# Patient Record
Sex: Female | Born: 1970
Health system: Southern US, Community
[De-identification: ages and names within clinical notes are randomized; demographics above are authoritative.]

## PROBLEM LIST (undated history)

## (undated) DIAGNOSIS — F419 Anxiety disorder, unspecified: Secondary | ICD-10-CM

## (undated) HISTORY — DX: Anxiety disorder, unspecified: F41.9

## (undated) HISTORY — PX: FOOT SURGERY: SHX648

---

## 2011-06-20 ENCOUNTER — Other Ambulatory Visit: Payer: Self-pay | Admitting: Obstetrics and Gynecology

## 2011-06-20 DIAGNOSIS — N63 Unspecified lump in unspecified breast: Secondary | ICD-10-CM

## 2011-07-01 ENCOUNTER — Ambulatory Visit
Admission: RE | Admit: 2011-07-01 | Discharge: 2011-07-01 | Disposition: A | Discharge status: Home / Self Care | Payer: BC Managed Care – PPO | Source: Ambulatory Visit | Attending: Obstetrics and Gynecology | Admitting: Obstetrics and Gynecology

## 2011-07-01 DIAGNOSIS — N63 Unspecified lump in unspecified breast: Secondary | ICD-10-CM

## 2012-07-23 ENCOUNTER — Other Ambulatory Visit: Payer: Self-pay | Admitting: Obstetrics and Gynecology

## 2012-07-23 DIAGNOSIS — Z1231 Encounter for screening mammogram for malignant neoplasm of breast: Secondary | ICD-10-CM

## 2012-09-03 ENCOUNTER — Ambulatory Visit: Payer: BC Managed Care – PPO

## 2012-10-01 ENCOUNTER — Ambulatory Visit
Admission: RE | Admit: 2012-10-01 | Discharge: 2012-10-01 | Disposition: A | Discharge status: Home / Self Care | Payer: BC Managed Care – PPO | Source: Ambulatory Visit | Attending: Obstetrics and Gynecology | Admitting: Obstetrics and Gynecology

## 2012-10-01 DIAGNOSIS — Z1231 Encounter for screening mammogram for malignant neoplasm of breast: Secondary | ICD-10-CM

## 2013-11-07 ENCOUNTER — Other Ambulatory Visit: Payer: Self-pay

## 2013-11-07 DIAGNOSIS — Z1231 Encounter for screening mammogram for malignant neoplasm of breast: Secondary | ICD-10-CM

## 2013-11-18 ENCOUNTER — Ambulatory Visit: Payer: BC Managed Care – PPO

## 2013-11-25 ENCOUNTER — Other Ambulatory Visit: Payer: Self-pay | Admitting: Obstetrics and Gynecology

## 2013-11-25 ENCOUNTER — Other Ambulatory Visit: Payer: Self-pay

## 2013-11-25 DIAGNOSIS — N644 Mastodynia: Secondary | ICD-10-CM

## 2013-12-02 ENCOUNTER — Ambulatory Visit
Admission: RE | Admit: 2013-12-02 | Discharge: 2013-12-02 | Disposition: A | Discharge status: Home / Self Care | Payer: BC Managed Care – PPO | Source: Ambulatory Visit | Attending: Obstetrics and Gynecology | Admitting: Obstetrics and Gynecology

## 2013-12-02 DIAGNOSIS — N644 Mastodynia: Secondary | ICD-10-CM

## 2014-09-08 ENCOUNTER — Ambulatory Visit (INDEPENDENT_AMBULATORY_CARE_PROVIDER_SITE_OTHER): Payer: BLUE CROSS/BLUE SHIELD | Admitting: Sports Medicine

## 2014-09-08 ENCOUNTER — Ambulatory Visit (INDEPENDENT_AMBULATORY_CARE_PROVIDER_SITE_OTHER): Payer: BLUE CROSS/BLUE SHIELD

## 2014-09-08 ENCOUNTER — Encounter: Payer: Self-pay | Admitting: Sports Medicine

## 2014-09-08 VITALS — BP 113/69 | HR 69 | Ht 66.0 in | Wt 122.0 lb

## 2014-09-08 DIAGNOSIS — M545 Low back pain, unspecified: Secondary | ICD-10-CM

## 2014-09-08 MED ORDER — DIAZEPAM 5 MG PO TABS: 5.0000 mg | ORAL_TABLET | Freq: Every evening | ORAL | Status: DC | PRN

## 2014-09-08 MED ORDER — MELOXICAM 15 MG PO TABS: ORAL_TABLET | ORAL | Status: DC

## 2014-09-08 MED ORDER — PREDNISONE 50 MG PO TABS: ORAL_TABLET | ORAL | Status: DC

## 2014-09-08 NOTE — Assessment & Plan Note (Signed)
Symptoms do sound predominantly discogenic. X-rays, prednisone, meloxicam, Valium at bedtime. Home rehabilitation exercises.  Return in one month, MRI of no better.

## 2014-09-08 NOTE — Progress Notes (Signed)
  Subjective:    CC: Low back pain.   HPI:  This is a pleasant 44 year old female, for the past week she's had increasing pain in the left side of her mid paralumbar spine, no radiation, the radicular pain, no constitutional symptoms. Pain is moderate, persistent. It is worse with flexion suggesting a discogenic component.  Past medical history, Surgical history, Family history not pertinant except as noted below, Social history, Allergies, and medications have been entered into the medical record, reviewed, and no changes needed.   Review of Systems: No headache, visual changes, nausea, vomiting, diarrhea, constipation, dizziness, abdominal pain, skin rash, fevers, chills, night sweats, swollen lymph nodes, weight loss, chest pain, body aches, joint swelling, muscle aches, shortness of breath, mood changes, visual or auditory hallucinations.  Objective:    General: Well Developed, well nourished, and in no acute distress.  Neuro: Alert and oriented x3, extra-ocular muscles intact, sensation grossly intact.  HEENT: Normocephalic, atraumatic, pupils equal round reactive to light, neck supple, no masses, no lymphadenopathy, thyroid nonpalpable.  Skin: Warm and dry, no rashes noted.  Cardiac: Regular rate and rhythm, no murmurs rubs or gallops.  Respiratory: Clear to auscultation bilaterally. Not using accessory muscles, speaking in full sentences.  Abdominal: Soft, nontender, nondistended, positive bowel sounds, no masses, no organomegaly.  Back Exam:  Inspection: Unremarkable  Motion: Flexion 45 deg, Extension 45 deg, Side Bending to 45 deg bilaterally,  Rotation to 45 deg bilaterally  SLR laying: Negative  XSLR laying: Negative  Palpable tenderness: Left paralumbar, and left sacroiliac joint. FABER: negative. Sensory change: Gross sensation intact to all lumbar and sacral dermatomes.  Reflexes: 2+ at both patellar tendons, 2+ at achilles tendons, Babinski's downgoing.  Strength at foot    Plantar-flexion: 5/5 Dorsi-flexion: 5/5 Eversion: 5/5 Inversion: 5/5  Leg strength  Quad: 5/5 Hamstring: 5/5 Hip flexor: 5/5 Hip abductors: 5/5  Gait unremarkable.  X-rays showed age-appropriate endplate changes.  Impression and Recommendations:    The patient was counselled, risk factors were discussed, anticipatory guidance given.

## 2014-10-06 ENCOUNTER — Ambulatory Visit: Payer: BLUE CROSS/BLUE SHIELD | Admitting: Sports Medicine

## 2016-01-25 DIAGNOSIS — Z681 Body mass index (BMI) 19 or less, adult: Secondary | ICD-10-CM | POA: Diagnosis not present

## 2016-01-25 DIAGNOSIS — Z01419 Encounter for gynecological examination (general) (routine) without abnormal findings: Secondary | ICD-10-CM | POA: Diagnosis not present

## 2016-01-25 DIAGNOSIS — Z1151 Encounter for screening for human papillomavirus (HPV): Secondary | ICD-10-CM | POA: Diagnosis not present

## 2016-01-25 DIAGNOSIS — Z1231 Encounter for screening mammogram for malignant neoplasm of breast: Secondary | ICD-10-CM | POA: Diagnosis not present

## 2016-03-29 DIAGNOSIS — M25531 Pain in right wrist: Secondary | ICD-10-CM | POA: Diagnosis not present

## 2016-03-29 DIAGNOSIS — H6993 Unspecified Eustachian tube disorder, bilateral: Secondary | ICD-10-CM | POA: Diagnosis not present

## 2016-05-20 ENCOUNTER — Encounter: Payer: Self-pay | Admitting: Sports Medicine

## 2016-05-20 ENCOUNTER — Ambulatory Visit (INDEPENDENT_AMBULATORY_CARE_PROVIDER_SITE_OTHER): Payer: BLUE CROSS/BLUE SHIELD | Admitting: Sports Medicine

## 2016-05-20 DIAGNOSIS — M25531 Pain in right wrist: Secondary | ICD-10-CM

## 2016-05-20 MED ORDER — MELOXICAM 15 MG PO TABS: ORAL_TABLET | ORAL | 3 refills | Status: DC

## 2016-05-20 NOTE — Assessment & Plan Note (Signed)
Multifactorial including thumb basal joint arthritis, likely some carpal tunnel syndrome, and de Quervain's tenosynovitis. Next line she does have a thumb spica brace that she will wear daily and night for a couple of weeks. Adding meloxicam, her symptoms are significant better after a prednisone taper at urgent care. Rehabilitation exercises given. Return to see me as needed in 2-4 weeks.

## 2016-05-20 NOTE — Progress Notes (Signed)
  Subjective:    CC: Right wrist pain  HPI: For the past 2 months this pleasant 45 year old female has had pain that she localizes on the volar aspect of her right wrist with occasional shooting pains into the fingers, also with some discomfort over the dorsal lateral distal radius. No trauma. She has increased her workout regimen, and has been moving a lot of heavy items at work. Pain is mild, persistent. She was seen in urgent care where x-rays were negative and she was given prednisone and overall feels much better right now.  Past medical history:  Negative.  See flowsheet/record as well for more information.  Surgical history: Negative.  See flowsheet/record as well for more information.  Family history: Negative.  See flowsheet/record as well for more information.  Social history: Negative.  See flowsheet/record as well for more information.  Allergies, and medications have been entered into the medical record, reviewed, and no changes needed.   Review of Systems: No fevers, chills, night sweats, weight loss, chest pain, or shortness of breath.   Objective:    General: Well Developed, well nourished, and in no acute distress.  Neuro: Alert and oriented x3, extra-ocular muscles intact, sensation grossly intact.  HEENT: Normocephalic, atraumatic, pupils equal round reactive to light, neck supple, no masses, no lymphadenopathy, thyroid nonpalpable.  Skin: Warm and dry, no rashes. Cardiac: Regular rate and rhythm, no murmurs rubs or gallops, no lower extremity edema.  Respiratory: Clear to auscultation bilaterally. Not using accessory muscles, speaking in full sentences. Right Wrist: Maybe slight fullness over the proximal first extensor Department of the dorsum of the wrist ROM smooth and normal with good flexion and extension and ulnar/radial deviation that is symmetrical with opposite wrist. Palpation is normal over metacarpals, navicular, lunate, and TFCC; tendons without tenderness/  swelling No snuffbox tenderness. No tenderness over Canal of [place]. Strength 5/5 in all directions without pain. Negative Finkelstein, tinel's and phalens. Negative Watson's test. There is mild tenderness to palpation at the thumb basal joint and the volar radiocarpal joint.  Impression and Recommendations:    Right wrist pain Multifactorial including thumb basal joint arthritis, likely some carpal tunnel syndrome, and de Quervain's tenosynovitis. Next line she does have a thumb spica brace that she will wear daily and night for a couple of weeks. Adding meloxicam, her symptoms are significant better after a prednisone taper at urgent care. Rehabilitation exercises given. Return to see me as needed in 2-4 weeks.

## 2016-05-30 DIAGNOSIS — J329 Chronic sinusitis, unspecified: Secondary | ICD-10-CM | POA: Diagnosis not present

## 2016-09-21 DIAGNOSIS — H9202 Otalgia, left ear: Secondary | ICD-10-CM | POA: Diagnosis not present

## 2016-09-21 DIAGNOSIS — H6982 Other specified disorders of Eustachian tube, left ear: Secondary | ICD-10-CM | POA: Diagnosis not present

## 2016-10-19 DIAGNOSIS — H6982 Other specified disorders of Eustachian tube, left ear: Secondary | ICD-10-CM | POA: Diagnosis not present

## 2017-02-16 DIAGNOSIS — Z01419 Encounter for gynecological examination (general) (routine) without abnormal findings: Secondary | ICD-10-CM | POA: Diagnosis not present

## 2017-02-16 DIAGNOSIS — Z1231 Encounter for screening mammogram for malignant neoplasm of breast: Secondary | ICD-10-CM | POA: Diagnosis not present

## 2017-02-16 DIAGNOSIS — Z6820 Body mass index (BMI) 20.0-20.9, adult: Secondary | ICD-10-CM | POA: Diagnosis not present

## 2017-07-27 DIAGNOSIS — H9202 Otalgia, left ear: Secondary | ICD-10-CM | POA: Diagnosis not present

## 2017-07-27 DIAGNOSIS — H6982 Other specified disorders of Eustachian tube, left ear: Secondary | ICD-10-CM | POA: Diagnosis not present

## 2017-10-02 DIAGNOSIS — H04123 Dry eye syndrome of bilateral lacrimal glands: Secondary | ICD-10-CM | POA: Diagnosis not present

## 2017-10-25 DIAGNOSIS — H5213 Myopia, bilateral: Secondary | ICD-10-CM | POA: Diagnosis not present

## 2018-03-13 DIAGNOSIS — Z6820 Body mass index (BMI) 20.0-20.9, adult: Secondary | ICD-10-CM | POA: Diagnosis not present

## 2018-03-13 DIAGNOSIS — Z01419 Encounter for gynecological examination (general) (routine) without abnormal findings: Secondary | ICD-10-CM | POA: Diagnosis not present

## 2018-03-15 ENCOUNTER — Ambulatory Visit
Admission: RE | Admit: 2018-03-15 | Discharge: 2018-03-15 | Disposition: A | Discharge status: Home / Self Care | Payer: BLUE CROSS/BLUE SHIELD | Source: Ambulatory Visit | Attending: Obstetrics and Gynecology | Admitting: Obstetrics and Gynecology

## 2018-03-15 ENCOUNTER — Other Ambulatory Visit: Payer: Self-pay | Admitting: Obstetrics and Gynecology

## 2018-03-15 ENCOUNTER — Ambulatory Visit: Payer: Self-pay

## 2018-03-15 DIAGNOSIS — N644 Mastodynia: Secondary | ICD-10-CM

## 2018-03-15 DIAGNOSIS — R922 Inconclusive mammogram: Secondary | ICD-10-CM | POA: Diagnosis not present

## 2018-09-06 DIAGNOSIS — H10021 Other mucopurulent conjunctivitis, right eye: Secondary | ICD-10-CM | POA: Diagnosis not present

## 2019-02-11 DIAGNOSIS — M25551 Pain in right hip: Secondary | ICD-10-CM | POA: Diagnosis not present

## 2019-02-11 DIAGNOSIS — M9905 Segmental and somatic dysfunction of pelvic region: Secondary | ICD-10-CM | POA: Diagnosis not present

## 2019-02-11 DIAGNOSIS — M461 Sacroiliitis, not elsewhere classified: Secondary | ICD-10-CM | POA: Diagnosis not present

## 2019-02-11 DIAGNOSIS — M9904 Segmental and somatic dysfunction of sacral region: Secondary | ICD-10-CM | POA: Diagnosis not present

## 2019-02-13 DIAGNOSIS — M25551 Pain in right hip: Secondary | ICD-10-CM | POA: Diagnosis not present

## 2019-02-13 DIAGNOSIS — M9904 Segmental and somatic dysfunction of sacral region: Secondary | ICD-10-CM | POA: Diagnosis not present

## 2019-02-13 DIAGNOSIS — M9905 Segmental and somatic dysfunction of pelvic region: Secondary | ICD-10-CM | POA: Diagnosis not present

## 2019-02-13 DIAGNOSIS — M461 Sacroiliitis, not elsewhere classified: Secondary | ICD-10-CM | POA: Diagnosis not present

## 2019-02-18 DIAGNOSIS — M9905 Segmental and somatic dysfunction of pelvic region: Secondary | ICD-10-CM | POA: Diagnosis not present

## 2019-02-18 DIAGNOSIS — M25551 Pain in right hip: Secondary | ICD-10-CM | POA: Diagnosis not present

## 2019-02-18 DIAGNOSIS — M9904 Segmental and somatic dysfunction of sacral region: Secondary | ICD-10-CM | POA: Diagnosis not present

## 2019-02-18 DIAGNOSIS — M461 Sacroiliitis, not elsewhere classified: Secondary | ICD-10-CM | POA: Diagnosis not present

## 2019-02-20 DIAGNOSIS — M9904 Segmental and somatic dysfunction of sacral region: Secondary | ICD-10-CM | POA: Diagnosis not present

## 2019-02-20 DIAGNOSIS — M25551 Pain in right hip: Secondary | ICD-10-CM | POA: Diagnosis not present

## 2019-02-20 DIAGNOSIS — M9905 Segmental and somatic dysfunction of pelvic region: Secondary | ICD-10-CM | POA: Diagnosis not present

## 2019-02-20 DIAGNOSIS — M461 Sacroiliitis, not elsewhere classified: Secondary | ICD-10-CM | POA: Diagnosis not present

## 2019-02-22 DIAGNOSIS — M9905 Segmental and somatic dysfunction of pelvic region: Secondary | ICD-10-CM | POA: Diagnosis not present

## 2019-02-22 DIAGNOSIS — M461 Sacroiliitis, not elsewhere classified: Secondary | ICD-10-CM | POA: Diagnosis not present

## 2019-02-22 DIAGNOSIS — M25551 Pain in right hip: Secondary | ICD-10-CM | POA: Diagnosis not present

## 2019-02-22 DIAGNOSIS — M9904 Segmental and somatic dysfunction of sacral region: Secondary | ICD-10-CM | POA: Diagnosis not present

## 2019-02-27 DIAGNOSIS — M461 Sacroiliitis, not elsewhere classified: Secondary | ICD-10-CM | POA: Diagnosis not present

## 2019-02-27 DIAGNOSIS — M9905 Segmental and somatic dysfunction of pelvic region: Secondary | ICD-10-CM | POA: Diagnosis not present

## 2019-02-27 DIAGNOSIS — M25551 Pain in right hip: Secondary | ICD-10-CM | POA: Diagnosis not present

## 2019-02-27 DIAGNOSIS — M9904 Segmental and somatic dysfunction of sacral region: Secondary | ICD-10-CM | POA: Diagnosis not present

## 2019-03-01 DIAGNOSIS — M461 Sacroiliitis, not elsewhere classified: Secondary | ICD-10-CM | POA: Diagnosis not present

## 2019-03-01 DIAGNOSIS — M9904 Segmental and somatic dysfunction of sacral region: Secondary | ICD-10-CM | POA: Diagnosis not present

## 2019-03-01 DIAGNOSIS — M9902 Segmental and somatic dysfunction of thoracic region: Secondary | ICD-10-CM | POA: Diagnosis not present

## 2019-03-01 DIAGNOSIS — M9905 Segmental and somatic dysfunction of pelvic region: Secondary | ICD-10-CM | POA: Diagnosis not present

## 2019-03-01 DIAGNOSIS — M9903 Segmental and somatic dysfunction of lumbar region: Secondary | ICD-10-CM | POA: Diagnosis not present

## 2019-03-01 DIAGNOSIS — M25551 Pain in right hip: Secondary | ICD-10-CM | POA: Diagnosis not present

## 2019-03-01 DIAGNOSIS — M546 Pain in thoracic spine: Secondary | ICD-10-CM | POA: Diagnosis not present

## 2019-03-01 DIAGNOSIS — M545 Low back pain: Secondary | ICD-10-CM | POA: Diagnosis not present

## 2019-03-27 DIAGNOSIS — Z1151 Encounter for screening for human papillomavirus (HPV): Secondary | ICD-10-CM | POA: Diagnosis not present

## 2019-03-27 DIAGNOSIS — Z01419 Encounter for gynecological examination (general) (routine) without abnormal findings: Secondary | ICD-10-CM | POA: Diagnosis not present

## 2019-03-27 DIAGNOSIS — Z1231 Encounter for screening mammogram for malignant neoplasm of breast: Secondary | ICD-10-CM | POA: Diagnosis not present

## 2019-03-27 DIAGNOSIS — Z6820 Body mass index (BMI) 20.0-20.9, adult: Secondary | ICD-10-CM | POA: Diagnosis not present

## 2019-05-01 ENCOUNTER — Other Ambulatory Visit: Payer: Self-pay

## 2019-05-01 ENCOUNTER — Ambulatory Visit (INDEPENDENT_AMBULATORY_CARE_PROVIDER_SITE_OTHER): Payer: BC Managed Care – PPO | Admitting: Sports Medicine

## 2019-05-01 ENCOUNTER — Encounter: Payer: Self-pay | Admitting: Sports Medicine

## 2019-05-01 DIAGNOSIS — R29818 Other symptoms and signs involving the nervous system: Secondary | ICD-10-CM | POA: Diagnosis not present

## 2019-05-01 DIAGNOSIS — R278 Other lack of coordination: Secondary | ICD-10-CM | POA: Diagnosis not present

## 2019-05-01 DIAGNOSIS — F39 Unspecified mood [affective] disorder: Secondary | ICD-10-CM | POA: Diagnosis not present

## 2019-05-01 MED ORDER — DIAZEPAM 5 MG PO TABS: ORAL_TABLET | ORAL | 0 refills | Status: DC

## 2019-05-01 MED ORDER — ESCITALOPRAM OXALATE 5 MG PO TABS: 5.0000 mg | ORAL_TABLET | Freq: Every day | ORAL | 3 refills | Status: DC

## 2019-05-01 NOTE — Progress Notes (Signed)
Subjective:    CC: Vibrating sensation  HPI: This is a pleasant 48 year old female, for the past several months she is noted an odd sensation of feeling shaky, usually in bed in the evenings after her busy days of ended.  She has a very busy schedule, she runs a business, she has an autistic child, trying to homeschool him, she has had anxiety and depression in the past.  She denies any suicidal or homicidal ideation.  She does have some family history of a movement disorder including Parkinson's disease, and has noted a bit of tremor in her hands.  I reviewed the past medical history, family history, social history, surgical history, and allergies today and no changes were needed.  Please see the problem list section below in epic for further details.  Past Medical History: No past medical history on file. Past Surgical History: No past surgical history on file. Social History: Social History   Socioeconomic History  . Marital status: Unknown    Spouse name: Not on file  . Number of children: Not on file  . Years of education: Not on file  . Highest education level: Not on file  Occupational History  . Not on file  Social Needs  . Financial resource strain: Not on file  . Food insecurity    Worry: Not on file    Inability: Not on file  . Transportation needs    Medical: Not on file    Non-medical: Not on file  Tobacco Use  . Smoking status: Never Smoker  . Smokeless tobacco: Never Used  Substance and Sexual Activity  . Alcohol use: Not on file  . Drug use: Not on file  . Sexual activity: Not on file  Lifestyle  . Physical activity    Days per week: Not on file    Minutes per session: Not on file  . Stress: Not on file  Relationships  . Social Musicianconnections    Talks on phone: Not on file    Gets together: Not on file    Attends religious service: Not on file    Active member of club or organization: Not on file    Attends meetings of clubs or organizations: Not on  file    Relationship status: Not on file  Other Topics Concern  . Not on file  Social History Narrative  . Not on file   Family History: Family History  Problem Relation Age of Onset  . Breast cancer Neg Hx    Allergies: No Known Allergies Medications: See med rec.  Review of Systems: No fevers, chills, night sweats, weight loss, chest pain, or shortness of breath.   Objective:    General: Well Developed, well nourished, and in no acute distress.  Neuro: Alert and oriented x3, extra-ocular muscles intact, sensation grossly intact.  Cranial nerves II through XII are intact, motor, sensory functions are intact, she did have dysdiadochokinesia of her left hand.  No finger dysmetria. HEENT: Normocephalic, atraumatic, pupils equal round reactive to light, neck supple, no masses, no lymphadenopathy, thyroid nonpalpable.  Skin: Warm and dry, no rashes. Cardiac: Regular rate and rhythm, no murmurs rubs or gallops, no lower extremity edema.  Respiratory: Clear to auscultation bilaterally. Not using accessory muscles, speaking in full sentences.  Impression and Recommendations:    Dysdiadochokinesia Left hand dysdiadochokinesia, adding a brain MRI with and without contrast.   We will need renal function first. Valium for claustrophobia.  Mood disorder (HCC) There is definitely an element of  anxiety and depression. Adding Lexapro 5, referral for behavioral therapy. Return in 6 weeks for a repeat PHQ and GAD.   ___________________________________________ Gwen Her. Dianah Field, M.D., ABFM., CAQSM. Primary Care and Sports Medicine [facility]  Adjunct Professor of [place] of [facility] of [employer]

## 2019-05-01 NOTE — Assessment & Plan Note (Signed)
Left hand dysdiadochokinesia, adding a brain MRI with and without contrast.   We will need renal function first. Valium for claustrophobia.

## 2019-05-01 NOTE — Assessment & Plan Note (Signed)
There is definitely an element of anxiety and depression. Adding Lexapro 5, referral for behavioral therapy. Return in 6 weeks for a repeat PHQ and GAD.

## 2019-05-02 LAB — COMPLETE METABOLIC PANEL WITH GFR
AG Ratio: 1.9 (calc) (ref 1.0–2.5)
ALT: 13 U/L (ref 6–29)
AST: 17 U/L (ref 10–35)
Albumin: 4.6 g/dL (ref 3.6–5.1)
Alkaline phosphatase (APISO): 35 U/L (ref 31–125)
BUN: 11 mg/dL (ref 7–25)
CO2: 28 mmol/L (ref 20–32)
Calcium: 9.4 mg/dL (ref 8.6–10.2)
Chloride: 103 mmol/L (ref 98–110)
Creat: 0.7 mg/dL (ref 0.50–1.10)
GFR, Est African American: 119 mL/min/{1.73_m2} (ref >=60)
GFR, Est Non African American: 102 mL/min/{1.73_m2} (ref >=60)
Globulin: 2.4 g/dL (calc) (ref 1.9–3.7)
Glucose, Bld: 102 mg/dL — ABNORMAL HIGH (ref 65–99)
Potassium: 4.5 mmol/L (ref 3.5–5.3)
Sodium: 140 mmol/L (ref 135–146)
Total Bilirubin: 0.6 mg/dL (ref 0.2–1.2)
Total Protein: 7 g/dL (ref 6.1–8.1)

## 2019-05-06 ENCOUNTER — Other Ambulatory Visit: Payer: Self-pay

## 2019-05-06 ENCOUNTER — Ambulatory Visit (INDEPENDENT_AMBULATORY_CARE_PROVIDER_SITE_OTHER): Payer: BC Managed Care – PPO

## 2019-05-06 DIAGNOSIS — R278 Other lack of coordination: Secondary | ICD-10-CM

## 2019-05-06 DIAGNOSIS — R29818 Other symptoms and signs involving the nervous system: Secondary | ICD-10-CM | POA: Diagnosis not present

## 2019-05-06 DIAGNOSIS — R251 Tremor, unspecified: Secondary | ICD-10-CM | POA: Diagnosis not present

## 2019-05-06 DIAGNOSIS — R51 Headache: Secondary | ICD-10-CM | POA: Diagnosis not present

## 2019-05-06 MED ORDER — GADOBUTROL 1 MMOL/ML IV SOLN
5.0000 mL | Freq: Once | INTRAVENOUS | Status: AC | PRN
  Administered 2019-05-06: 14:00:00 5 mL via INTRAVENOUS

## 2019-06-12 ENCOUNTER — Ambulatory Visit (INDEPENDENT_AMBULATORY_CARE_PROVIDER_SITE_OTHER): Payer: BC Managed Care – PPO | Admitting: Sports Medicine

## 2019-06-12 ENCOUNTER — Encounter: Payer: Self-pay | Admitting: Sports Medicine

## 2019-06-12 ENCOUNTER — Other Ambulatory Visit: Payer: Self-pay

## 2019-06-12 DIAGNOSIS — F39 Unspecified mood [affective] disorder: Secondary | ICD-10-CM | POA: Diagnosis not present

## 2019-06-12 MED ORDER — ESCITALOPRAM OXALATE 5 MG PO TABS: 5.0000 mg | ORAL_TABLET | Freq: Every day | ORAL | 1 refills | Status: DC

## 2019-06-12 NOTE — Assessment & Plan Note (Signed)
Fantastic improvement on Lexapro 5. She will establish with Dr. Emeterio Reeve.

## 2019-06-12 NOTE — Addendum Note (Signed)
Addended by: Silverio Decamp on: 06/12/2019 09:04 AM   Modules accepted: Orders

## 2019-06-12 NOTE — Progress Notes (Signed)
  Subjective:    CC: Follow-up  HPI: Anxiety and depression: Now well controlled on Lexapro 5.  I reviewed the past medical history, family history, social history, surgical history, and allergies today and no changes were needed.  Please see the problem list section below in epic for further details.  Past Medical History: No past medical history on file. Past Surgical History: No past surgical history on file. Social History: Social History   Socioeconomic History  . Marital status: Unknown    Spouse name: Not on file  . Number of children: Not on file  . Years of education: Not on file  . Highest education level: Not on file  Occupational History  . Not on file  Social Needs  . Financial resource strain: Not on file  . Food insecurity    Worry: Not on file    Inability: Not on file  . Transportation needs    Medical: Not on file    Non-medical: Not on file  Tobacco Use  . Smoking status: Never Smoker  . Smokeless tobacco: Never Used  Substance and Sexual Activity  . Alcohol use: Not on file  . Drug use: Not on file  . Sexual activity: Not on file  Lifestyle  . Physical activity    Days per week: Not on file    Minutes per session: Not on file  . Stress: Not on file  Relationships  . Social Herbalist on phone: Not on file    Gets together: Not on file    Attends religious service: Not on file    Active member of club or organization: Not on file    Attends meetings of clubs or organizations: Not on file    Relationship status: Not on file  Other Topics Concern  . Not on file  Social History Narrative  . Not on file   Family History: Family History  Problem Relation Age of Onset  . Breast cancer Neg Hx    Allergies: No Known Allergies Medications: See med rec.  Review of Systems: No fevers, chills, night sweats, weight loss, chest pain, or shortness of breath.   Objective:    General: Well Developed, well nourished, and in no acute  distress.  Neuro: Alert and oriented x3, extra-ocular muscles intact, sensation grossly intact.  HEENT: Normocephalic, atraumatic, pupils equal round reactive to light, neck supple, no masses, no lymphadenopathy, thyroid nonpalpable.  Skin: Warm and dry, no rashes. Cardiac: Regular rate and rhythm, no murmurs rubs or gallops, no lower extremity edema.  Respiratory: Clear to auscultation bilaterally. Not using accessory muscles, speaking in full sentences.  Impression and Recommendations:    Mood disorder ([place]) Fantastic improvement on Lexapro 5. She will establish with Dr. Emeterio Reeve.   ___________________________________________ Gwen Her. Dianah Field, M.D., ABFM., CAQSM. Primary Care and Sports Medicine [facility]  Adjunct Professor of [place] of [facility] of Medicine

## 2019-06-18 ENCOUNTER — Ambulatory Visit (INDEPENDENT_AMBULATORY_CARE_PROVIDER_SITE_OTHER): Payer: BC Managed Care – PPO | Admitting: Psychology

## 2019-06-18 DIAGNOSIS — F39 Unspecified mood [affective] disorder: Secondary | ICD-10-CM

## 2019-07-09 ENCOUNTER — Ambulatory Visit (INDEPENDENT_AMBULATORY_CARE_PROVIDER_SITE_OTHER): Payer: BC Managed Care – PPO | Admitting: Psychology

## 2019-07-09 DIAGNOSIS — F39 Unspecified mood [affective] disorder: Secondary | ICD-10-CM | POA: Diagnosis not present

## 2019-07-23 ENCOUNTER — Ambulatory Visit: Payer: BC Managed Care – PPO | Admitting: Psychology

## 2019-08-06 ENCOUNTER — Ambulatory Visit (INDEPENDENT_AMBULATORY_CARE_PROVIDER_SITE_OTHER): Payer: Self-pay | Admitting: Psychology

## 2019-08-06 DIAGNOSIS — F39 Unspecified mood [affective] disorder: Secondary | ICD-10-CM

## 2019-08-07 ENCOUNTER — Ambulatory Visit: Payer: BC Managed Care – PPO | Admitting: Psychology

## 2019-08-20 ENCOUNTER — Ambulatory Visit: Payer: Self-pay | Admitting: Psychology

## 2019-09-03 ENCOUNTER — Ambulatory Visit (INDEPENDENT_AMBULATORY_CARE_PROVIDER_SITE_OTHER): Payer: Self-pay | Admitting: Psychology

## 2019-09-03 DIAGNOSIS — F39 Unspecified mood [affective] disorder: Secondary | ICD-10-CM

## 2019-09-17 ENCOUNTER — Ambulatory Visit: Payer: Self-pay | Admitting: Psychology

## 2019-11-12 ENCOUNTER — Ambulatory Visit: Payer: Self-pay | Admitting: Psychology

## 2019-12-17 IMAGING — MR MR HEAD WO/W CM
13 series · 48 of 48 positions shown · IV contrast (5ML MULTIHANCE)
Comparison: None.

CLINICAL DATA: Tremors which are progressively worsening.
Occasional headache.

EXAM:
MRI HEAD WITHOUT AND WITH CONTRAST
TECHNIQUE: Multiplanar, multiecho pulse sequences of the brain and surrounding
structures were obtained without and with intravenous contrast.
CONTRAST:  5mL GADAVIST GADOBUTROL 1 MMOL/ML IV SOLN

[Series 3: DWI · axial · 3.0mm · 1.20mm/px · z∈[-59,+102]mm · 3 of 55 slices shown (1 of 2)]
[im 1/55]
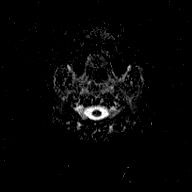
[im 28/55]
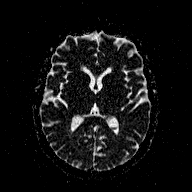
[im 55/55]
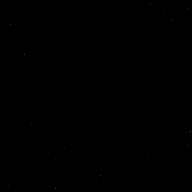

[Series 5: DWI · coronal · 3.0mm · 1.15mm/px · 2 of 45 slices shown (2 of 2)]
[im 1/45]
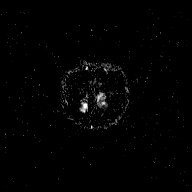
[im 45/45]
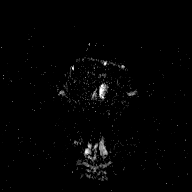

[Series 6: T1 · sagittal · 5.0mm · 0.45mm/px · 1 of 23 slices shown (1 of 2)]
[im 1/23]
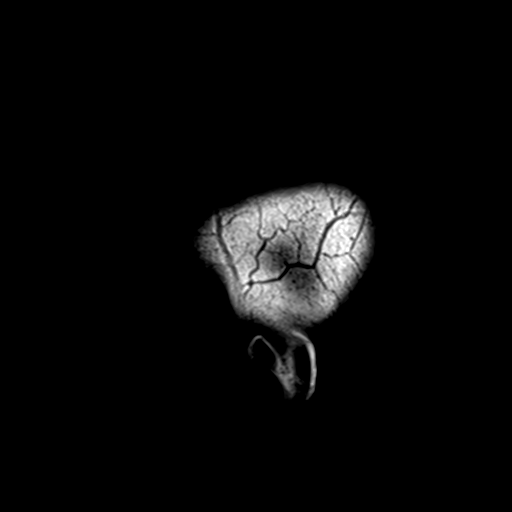

[Series 7: T2 · axial · 5.0mm · 0.72mm/px · 1 of 23 slices shown (1 of 2)]
[im 1/23]
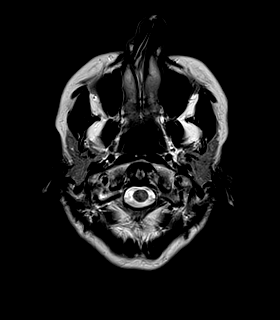

[Series 8: FLAIR · axial · 3.0mm · 0.45mm/px · z∈[-54,+106]mm · 4 of 55 slices shown (1 of 2)]
[im 1/55]
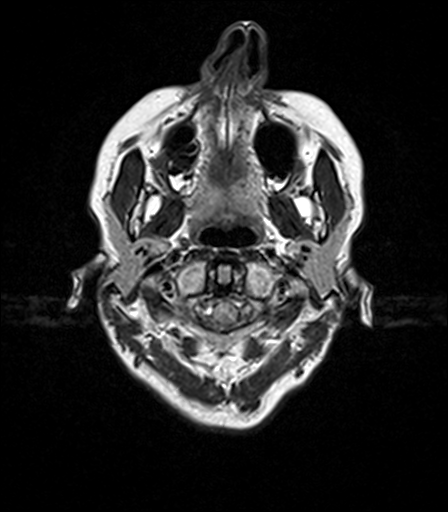
[im 19/55]
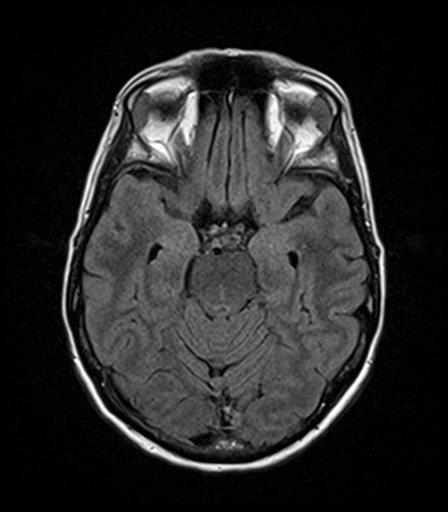
[im 37/55]
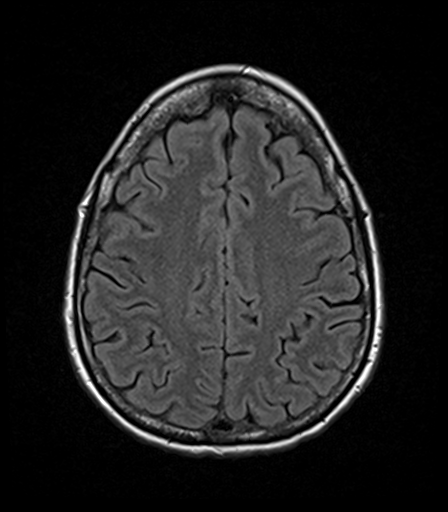
[im 55/55]
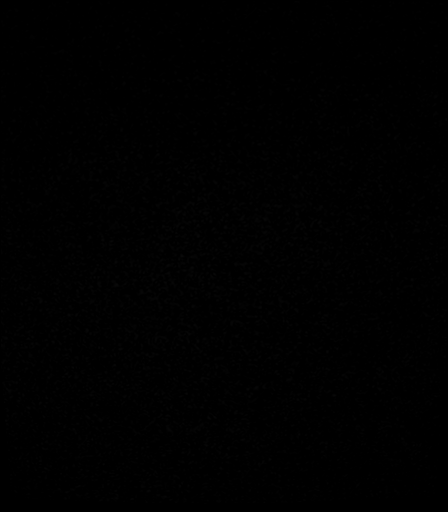

[Series 9: T2 · axial · 5.0mm · 0.72mm/px · z∈[-50,+102]mm · 2 of 23 slices shown (2 of 2)]
[im 1/23]
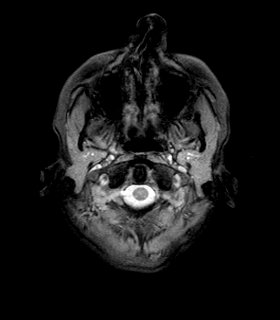
[im 23/23]
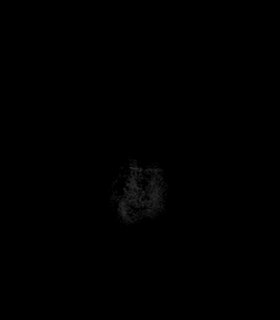

[Series 10: T1 · axial · 1.0mm · 1.00mm/px · z∈[-50,+107]mm · 11 of 160 slices shown (2 of 2)]
[im 1/160]
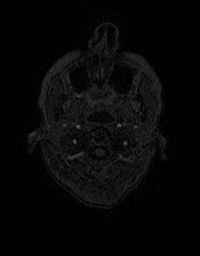
[im 16/160]
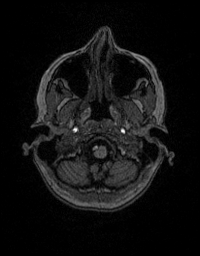
[im 32/160]
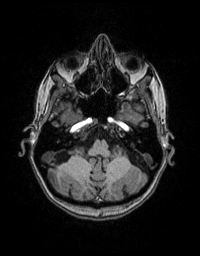
[im 48/160]
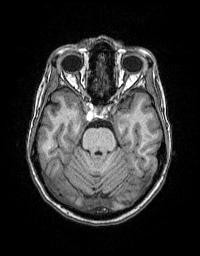
[im 64/160]
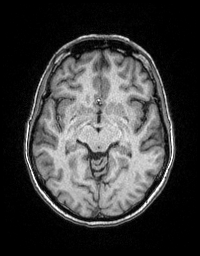
[im 80/160]
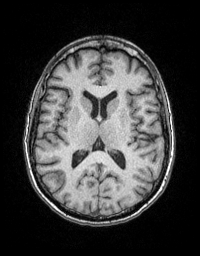
[im 96/160]
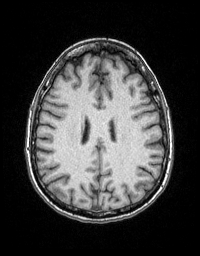
[im 112/160]
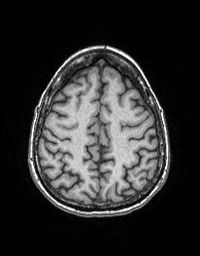
[im 128/160]
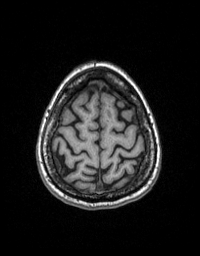
[im 144/160]
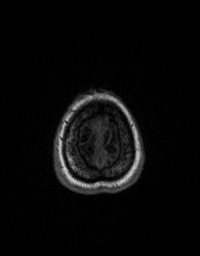
[im 160/160]
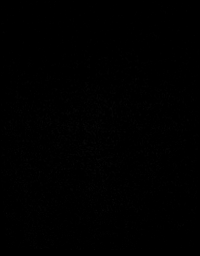

[Series 11: FLAIR · sagittal · 4.0mm · 0.45mm/px · 2 of 30 slices shown (2 of 2)]
[im 1/30]
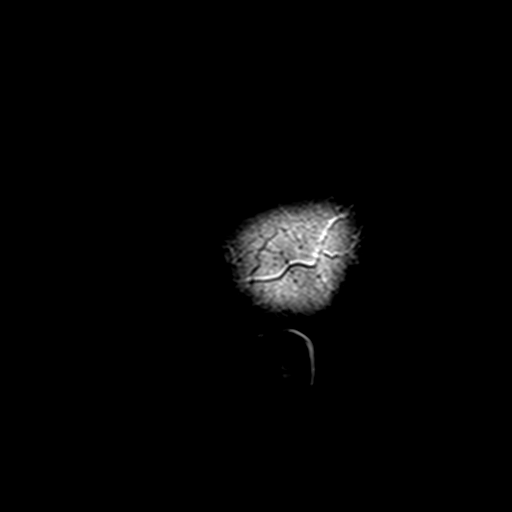
[im 30/30]
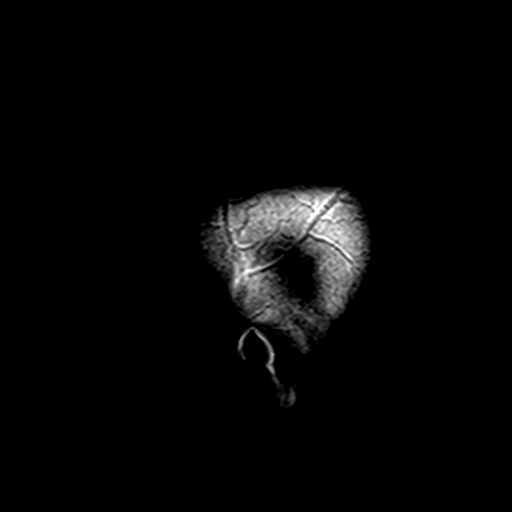

[Series 12: T2 post-contrast · coronal · 5.0mm · 0.43mm/px · 2 of 29 slices shown]
[im 1/29]
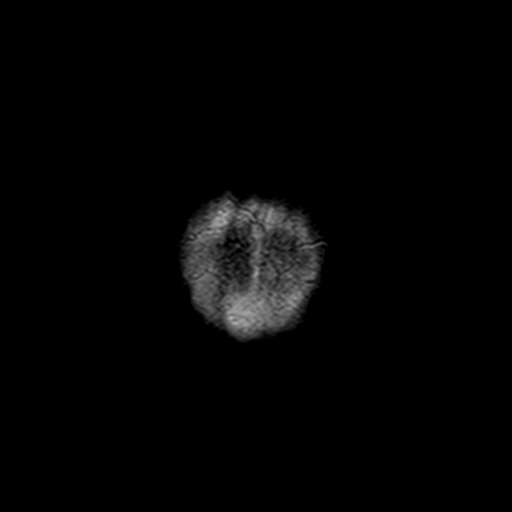
[im 29/29]
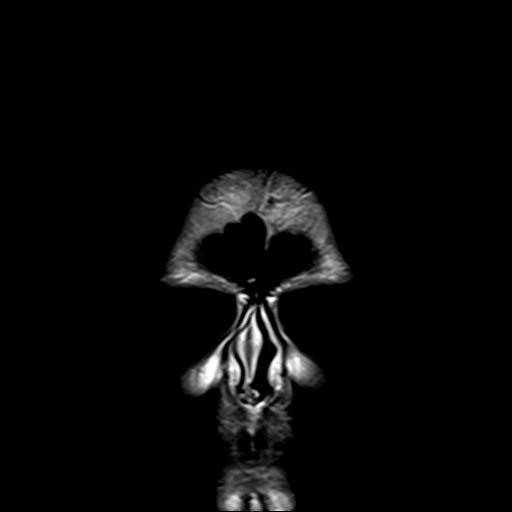

[Series 13: T1 post-contrast · axial · 1.0mm · 1.00mm/px · z∈[-50,+107]mm · 11 of 160 slices shown (1 of 2)]
[im 1/160]
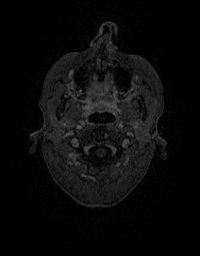
[im 16/160]
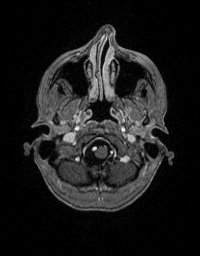
[im 32/160]
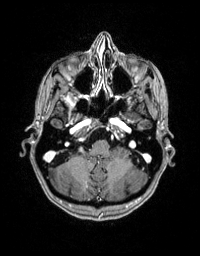
[im 48/160]
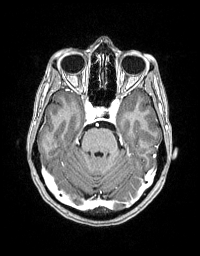
[im 64/160]
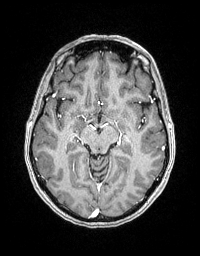
[im 80/160]
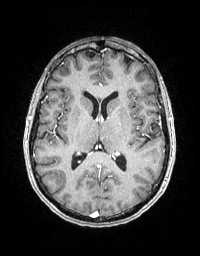
[im 96/160]
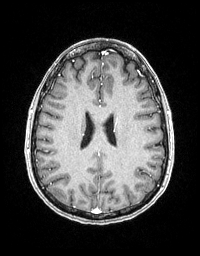
[im 112/160]
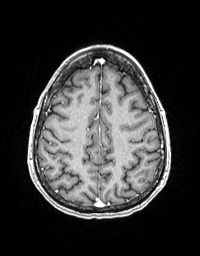
[im 128/160]
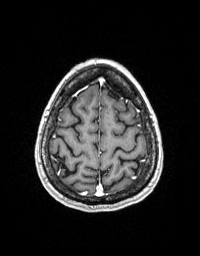
[im 144/160]
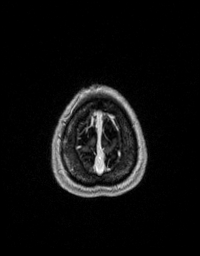
[im 160/160]
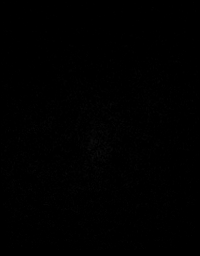

[Series 14: T1 post-contrast · coronal · 5.0mm · 0.43mm/px · 2 of 29 slices shown (2 of 2)]
[im 1/29]
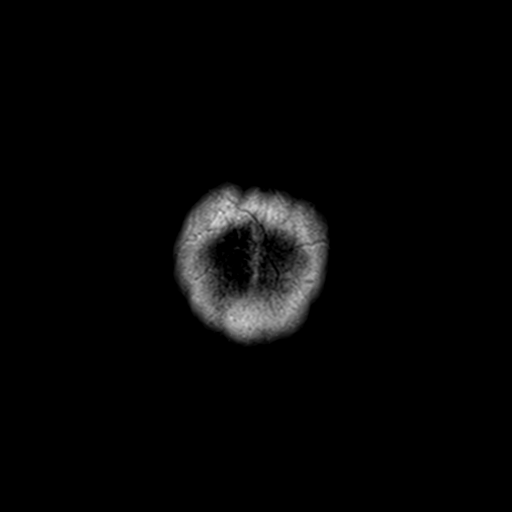
[im 29/29]
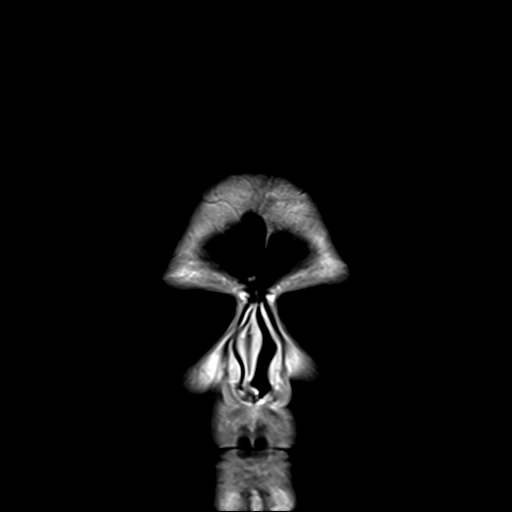

[Series 100: (id) ax · axial · 3.0mm · 1.20mm/px · z∈[-59,+102]mm · 4 of 55 slices shown]
[im 1/55]
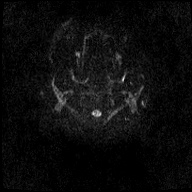
[im 19/55]
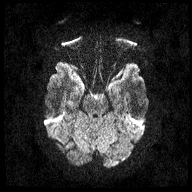
[im 37/55]
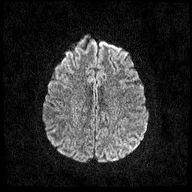
[im 55/55]
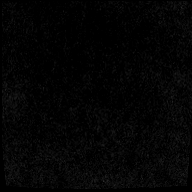

[Series 101: (id) cor · coronal · 3.0mm · 1.15mm/px · 3 of 42 slices shown]
[im 1/42]
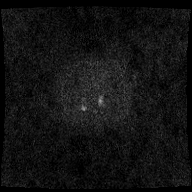
[im 21/42]
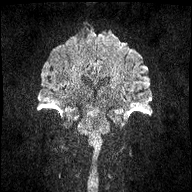
[im 42/42]
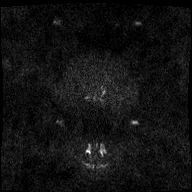

[48 of 48 positions shown; findings below may reference images not displayed]

FINDINGS: Brain: The brain has a normal appearance without evidence of
malformation, atrophy, old or acute small or large vessel
infarction, mass lesion, hemorrhage, hydrocephalus or extra-axial
collection. After contrast administration, no abnormal enhancement
occurs.

Vascular: Major vessels at the base of the brain show flow. Venous
sinuses appear patent.

Skull and upper cervical spine: Normal.

Sinuses/Orbits: Clear/normal.

Other: None significant.
IMPRESSION: Normal examination. No abnormality seen to explain the presenting
symptoms.

## 2019-12-30 ENCOUNTER — Other Ambulatory Visit: Payer: Self-pay | Admitting: Sports Medicine

## 2019-12-30 DIAGNOSIS — F39 Unspecified mood [affective] disorder: Secondary | ICD-10-CM

## 2020-05-04 DIAGNOSIS — H5213 Myopia, bilateral: Secondary | ICD-10-CM | POA: Diagnosis not present

## 2020-07-07 DIAGNOSIS — Z6820 Body mass index (BMI) 20.0-20.9, adult: Secondary | ICD-10-CM | POA: Diagnosis not present

## 2020-07-07 DIAGNOSIS — Z1231 Encounter for screening mammogram for malignant neoplasm of breast: Secondary | ICD-10-CM | POA: Diagnosis not present

## 2020-07-07 DIAGNOSIS — Z01419 Encounter for gynecological examination (general) (routine) without abnormal findings: Secondary | ICD-10-CM | POA: Diagnosis not present

## 2020-07-13 ENCOUNTER — Other Ambulatory Visit: Payer: Self-pay | Admitting: Sports Medicine

## 2020-07-13 DIAGNOSIS — F39 Unspecified mood [affective] disorder: Secondary | ICD-10-CM

## 2020-07-13 NOTE — Telephone Encounter (Signed)
Please contact patient for scheduling with Dr. Karie Schwalbe. Thanks.

## 2020-07-13 NOTE — Telephone Encounter (Signed)
LVM for patient to get this appointment scheduled. AM

## 2020-07-13 NOTE — Telephone Encounter (Signed)
30 days approved, has not been seen in over a year.  We should do at least a virtual visit.

## 2020-07-20 DIAGNOSIS — M65871 Other synovitis and tenosynovitis, right ankle and foot: Secondary | ICD-10-CM | POA: Diagnosis not present

## 2020-08-12 ENCOUNTER — Other Ambulatory Visit: Payer: Self-pay | Admitting: Sports Medicine

## 2020-08-12 DIAGNOSIS — F39 Unspecified mood [affective] disorder: Secondary | ICD-10-CM

## 2020-10-16 DIAGNOSIS — R42 Dizziness and giddiness: Secondary | ICD-10-CM | POA: Diagnosis not present

## 2020-10-16 DIAGNOSIS — S0990XA Unspecified injury of head, initial encounter: Secondary | ICD-10-CM | POA: Diagnosis not present

## 2020-10-16 DIAGNOSIS — R110 Nausea: Secondary | ICD-10-CM | POA: Diagnosis not present

## 2021-04-06 DIAGNOSIS — K5900 Constipation, unspecified: Secondary | ICD-10-CM | POA: Diagnosis not present

## 2021-04-06 DIAGNOSIS — K3189 Other diseases of stomach and duodenum: Secondary | ICD-10-CM | POA: Diagnosis not present

## 2021-04-06 DIAGNOSIS — R100 Acute abdomen: Secondary | ICD-10-CM | POA: Diagnosis not present

## 2021-04-06 DIAGNOSIS — R1031 Right lower quadrant pain: Secondary | ICD-10-CM | POA: Diagnosis not present

## 2021-04-06 DIAGNOSIS — N888 Other specified noninflammatory disorders of cervix uteri: Secondary | ICD-10-CM | POA: Diagnosis not present

## 2021-04-07 DIAGNOSIS — R100 Acute abdomen: Secondary | ICD-10-CM | POA: Diagnosis not present

## 2021-04-07 DIAGNOSIS — K3189 Other diseases of stomach and duodenum: Secondary | ICD-10-CM | POA: Diagnosis not present

## 2021-04-07 DIAGNOSIS — N888 Other specified noninflammatory disorders of cervix uteri: Secondary | ICD-10-CM | POA: Diagnosis not present

## 2021-04-08 ENCOUNTER — Telehealth: Payer: Self-pay

## 2021-04-08 NOTE — Telephone Encounter (Addendum)
Transition Care Management Follow-up Telephone Call Date of discharge and from where: 04/07/2021 from [facility]  How have you been since you were released from the hospital? Pt states that she is feeling slightly better. Pt did not have any questions or concerns at this time.  Any questions or concerns? No  Items Reviewed: Did the pt receive and understand the discharge instructions provided? Yes  Medications obtained and verified? Yes  Other? No  Any new allergies since your discharge? No  Dietary orders reviewed? No Do you have support at home? Yes   Functional Questionnaire: (I = Independent and D = Dependent) ADLs: I  Bathing/Dressing- I  Meal Prep- I  Eating- I  Maintaining continence- I  Transferring/Ambulation- I  Managing Meds- I   Follow up appointments reviewed:  PCP Hospital f/u appt confirmed? No   Specialist Hospital f/u appt confirmed? No   Are transportation arrangements needed? No  If their condition worsens, is the pt aware to call PCP or go to the Emergency Dept.? Yes Was the patient provided with contact information for the PCP's office or ED? Yes Was to pt encouraged to call back with questions or concerns? Yes

## 2021-07-08 DIAGNOSIS — Z6821 Body mass index (BMI) 21.0-21.9, adult: Secondary | ICD-10-CM | POA: Diagnosis not present

## 2021-07-08 DIAGNOSIS — Z01419 Encounter for gynecological examination (general) (routine) without abnormal findings: Secondary | ICD-10-CM | POA: Diagnosis not present

## 2021-07-08 DIAGNOSIS — Z1231 Encounter for screening mammogram for malignant neoplasm of breast: Secondary | ICD-10-CM | POA: Diagnosis not present

## 2021-07-08 DIAGNOSIS — Z124 Encounter for screening for malignant neoplasm of cervix: Secondary | ICD-10-CM | POA: Diagnosis not present

## 2021-07-08 DIAGNOSIS — Z113 Encounter for screening for infections with a predominantly sexual mode of transmission: Secondary | ICD-10-CM | POA: Diagnosis not present

## 2021-09-06 DIAGNOSIS — H5213 Myopia, bilateral: Secondary | ICD-10-CM | POA: Diagnosis not present

## 2021-10-29 DIAGNOSIS — M778 Other enthesopathies, not elsewhere classified: Secondary | ICD-10-CM | POA: Diagnosis not present

## 2022-05-30 NOTE — Progress Notes (Unsigned)
   New Patient Office Visit  Subjective:  Patient ID: Katelyn Hernandez, female    DOB: [birth year]  Age: 52 y.o. MRN: [id number]  CC: No chief complaint on file.    HPI Katelyn Hernandez presents to establish care.   No past medical history on file.  No past surgical history on file.  Family History  Problem Relation Age of Onset   Breast cancer Neg Hx     Social History   Socioeconomic History   Marital status: Unknown    Spouse name: Not on file   Number of children: Not on file   Years of education: Not on file   Highest education level: Not on file  Occupational History   Not on file  Tobacco Use   Smoking status: Never   Smokeless tobacco: Never  Substance and Sexual Activity   Alcohol use: Not on file   Drug use: Not on file   Sexual activity: Not on file  Other Topics Concern   Not on file  Social History Narrative   Not on file   Social Determinants of Health   Financial Resource Strain: Not on file  Food Insecurity: Not on file  Transportation Needs: Not on file  Physical Activity: Not on file  Stress: Not on file  Social Connections: Not on file  Intimate Partner Violence: Not on file    ROS Review of Systems  Objective:   Today's Vitals: There were no vitals taken for this visit.  Physical Exam  Assessment & Plan:   1. Mood disorder (HCC) ***  2. Encounter to establish care ***   Outpatient Encounter Medications as of 05/31/2022  Medication Sig   diazepam (VALIUM) 5 MG tablet 1 tab 2 hours before procedure or imaging, take another tab 30 minutes to 1 hour before if not feeling relaxed (do not drive on this med)   escitalopram (LEXAPRO) 5 MG tablet TAKE 1 TABLET BY MOUTH EVERY DAY   No facility-administered encounter medications on file as of 05/31/2022.    Follow-up: No follow-ups on file.   Clearnce Sorrel, DNP, APRN, FNP-BC [facility]

## 2022-05-31 ENCOUNTER — Encounter: Payer: Self-pay | Admitting: Medical-Surgical

## 2022-05-31 ENCOUNTER — Ambulatory Visit (INDEPENDENT_AMBULATORY_CARE_PROVIDER_SITE_OTHER): Payer: BC Managed Care – PPO | Admitting: Medical-Surgical

## 2022-05-31 VITALS — BP 109/71 | HR 66 | Resp 20 | Ht 66.0 in | Wt 133.3 lb

## 2022-05-31 DIAGNOSIS — Z23 Encounter for immunization: Secondary | ICD-10-CM | POA: Diagnosis not present

## 2022-05-31 DIAGNOSIS — Z1211 Encounter for screening for malignant neoplasm of colon: Secondary | ICD-10-CM

## 2022-05-31 DIAGNOSIS — Z7689 Persons encountering health services in other specified circumstances: Secondary | ICD-10-CM

## 2022-05-31 DIAGNOSIS — F39 Unspecified mood [affective] disorder: Secondary | ICD-10-CM

## 2022-05-31 MED ORDER — ESCITALOPRAM OXALATE 5 MG PO TABS: 5.0000 mg | ORAL_TABLET | Freq: Every day | ORAL | 1 refills | Status: DC

## 2022-07-26 DIAGNOSIS — Z01419 Encounter for gynecological examination (general) (routine) without abnormal findings: Secondary | ICD-10-CM | POA: Diagnosis not present

## 2022-07-26 DIAGNOSIS — Z124 Encounter for screening for malignant neoplasm of cervix: Secondary | ICD-10-CM | POA: Diagnosis not present

## 2022-07-26 DIAGNOSIS — Z6821 Body mass index (BMI) 21.0-21.9, adult: Secondary | ICD-10-CM | POA: Diagnosis not present

## 2022-07-26 DIAGNOSIS — Z1231 Encounter for screening mammogram for malignant neoplasm of breast: Secondary | ICD-10-CM | POA: Diagnosis not present

## 2022-07-26 LAB — HM MAMMOGRAPHY

## 2022-08-02 LAB — HM PAP SMEAR: HPV, high-risk: NEGATIVE

## 2022-12-10 ENCOUNTER — Other Ambulatory Visit: Payer: Self-pay | Admitting: Medical-Surgical

## 2022-12-10 DIAGNOSIS — F39 Unspecified mood [affective] disorder: Secondary | ICD-10-CM

## 2023-01-17 DIAGNOSIS — M9902 Segmental and somatic dysfunction of thoracic region: Secondary | ICD-10-CM | POA: Diagnosis not present

## 2023-01-17 DIAGNOSIS — M9901 Segmental and somatic dysfunction of cervical region: Secondary | ICD-10-CM | POA: Diagnosis not present

## 2023-01-17 DIAGNOSIS — M5413 Radiculopathy, cervicothoracic region: Secondary | ICD-10-CM | POA: Diagnosis not present

## 2023-01-17 DIAGNOSIS — M9904 Segmental and somatic dysfunction of sacral region: Secondary | ICD-10-CM | POA: Diagnosis not present

## 2023-01-18 DIAGNOSIS — M9901 Segmental and somatic dysfunction of cervical region: Secondary | ICD-10-CM | POA: Diagnosis not present

## 2023-01-18 DIAGNOSIS — M9904 Segmental and somatic dysfunction of sacral region: Secondary | ICD-10-CM | POA: Diagnosis not present

## 2023-01-18 DIAGNOSIS — M5413 Radiculopathy, cervicothoracic region: Secondary | ICD-10-CM | POA: Diagnosis not present

## 2023-01-18 DIAGNOSIS — M9902 Segmental and somatic dysfunction of thoracic region: Secondary | ICD-10-CM | POA: Diagnosis not present

## 2023-01-23 DIAGNOSIS — M9902 Segmental and somatic dysfunction of thoracic region: Secondary | ICD-10-CM | POA: Diagnosis not present

## 2023-01-23 DIAGNOSIS — M9901 Segmental and somatic dysfunction of cervical region: Secondary | ICD-10-CM | POA: Diagnosis not present

## 2023-01-23 DIAGNOSIS — M9904 Segmental and somatic dysfunction of sacral region: Secondary | ICD-10-CM | POA: Diagnosis not present

## 2023-01-23 DIAGNOSIS — M5413 Radiculopathy, cervicothoracic region: Secondary | ICD-10-CM | POA: Diagnosis not present

## 2023-01-25 DIAGNOSIS — M9901 Segmental and somatic dysfunction of cervical region: Secondary | ICD-10-CM | POA: Diagnosis not present

## 2023-01-25 DIAGNOSIS — M5413 Radiculopathy, cervicothoracic region: Secondary | ICD-10-CM | POA: Diagnosis not present

## 2023-01-25 DIAGNOSIS — M9904 Segmental and somatic dysfunction of sacral region: Secondary | ICD-10-CM | POA: Diagnosis not present

## 2023-01-25 DIAGNOSIS — M9902 Segmental and somatic dysfunction of thoracic region: Secondary | ICD-10-CM | POA: Diagnosis not present

## 2023-01-30 DIAGNOSIS — M5413 Radiculopathy, cervicothoracic region: Secondary | ICD-10-CM | POA: Diagnosis not present

## 2023-01-30 DIAGNOSIS — M9904 Segmental and somatic dysfunction of sacral region: Secondary | ICD-10-CM | POA: Diagnosis not present

## 2023-01-30 DIAGNOSIS — M9901 Segmental and somatic dysfunction of cervical region: Secondary | ICD-10-CM | POA: Diagnosis not present

## 2023-01-30 DIAGNOSIS — M9902 Segmental and somatic dysfunction of thoracic region: Secondary | ICD-10-CM | POA: Diagnosis not present

## 2023-02-06 DIAGNOSIS — M9904 Segmental and somatic dysfunction of sacral region: Secondary | ICD-10-CM | POA: Diagnosis not present

## 2023-02-06 DIAGNOSIS — M9901 Segmental and somatic dysfunction of cervical region: Secondary | ICD-10-CM | POA: Diagnosis not present

## 2023-02-06 DIAGNOSIS — M9902 Segmental and somatic dysfunction of thoracic region: Secondary | ICD-10-CM | POA: Diagnosis not present

## 2023-02-06 DIAGNOSIS — M5413 Radiculopathy, cervicothoracic region: Secondary | ICD-10-CM | POA: Diagnosis not present

## 2023-02-18 DIAGNOSIS — M9904 Segmental and somatic dysfunction of sacral region: Secondary | ICD-10-CM | POA: Diagnosis not present

## 2023-02-18 DIAGNOSIS — M5413 Radiculopathy, cervicothoracic region: Secondary | ICD-10-CM | POA: Diagnosis not present

## 2023-02-18 DIAGNOSIS — M9901 Segmental and somatic dysfunction of cervical region: Secondary | ICD-10-CM | POA: Diagnosis not present

## 2023-02-18 DIAGNOSIS — M9902 Segmental and somatic dysfunction of thoracic region: Secondary | ICD-10-CM | POA: Diagnosis not present

## 2023-03-20 DIAGNOSIS — M9902 Segmental and somatic dysfunction of thoracic region: Secondary | ICD-10-CM | POA: Diagnosis not present

## 2023-03-20 DIAGNOSIS — M5413 Radiculopathy, cervicothoracic region: Secondary | ICD-10-CM | POA: Diagnosis not present

## 2023-03-20 DIAGNOSIS — M9904 Segmental and somatic dysfunction of sacral region: Secondary | ICD-10-CM | POA: Diagnosis not present

## 2023-03-20 DIAGNOSIS — M9901 Segmental and somatic dysfunction of cervical region: Secondary | ICD-10-CM | POA: Diagnosis not present

## 2023-03-27 DIAGNOSIS — M9904 Segmental and somatic dysfunction of sacral region: Secondary | ICD-10-CM | POA: Diagnosis not present

## 2023-03-27 DIAGNOSIS — M9901 Segmental and somatic dysfunction of cervical region: Secondary | ICD-10-CM | POA: Diagnosis not present

## 2023-03-27 DIAGNOSIS — M5413 Radiculopathy, cervicothoracic region: Secondary | ICD-10-CM | POA: Diagnosis not present

## 2023-03-27 DIAGNOSIS — M9902 Segmental and somatic dysfunction of thoracic region: Secondary | ICD-10-CM | POA: Diagnosis not present

## 2023-04-05 ENCOUNTER — Encounter: Payer: Self-pay | Admitting: Medical-Surgical

## 2023-04-06 ENCOUNTER — Ambulatory Visit (INDEPENDENT_AMBULATORY_CARE_PROVIDER_SITE_OTHER): Payer: BC Managed Care – PPO

## 2023-04-06 ENCOUNTER — Ambulatory Visit (INDEPENDENT_AMBULATORY_CARE_PROVIDER_SITE_OTHER): Payer: BC Managed Care – PPO | Admitting: Sports Medicine

## 2023-04-06 ENCOUNTER — Other Ambulatory Visit: Payer: Self-pay | Admitting: Sports Medicine

## 2023-04-06 ENCOUNTER — Encounter: Payer: Self-pay | Admitting: Medical-Surgical

## 2023-04-06 DIAGNOSIS — M67921 Unspecified disorder of synovium and tendon, right upper arm: Secondary | ICD-10-CM

## 2023-04-06 DIAGNOSIS — M25511 Pain in right shoulder: Secondary | ICD-10-CM | POA: Diagnosis not present

## 2023-04-06 DIAGNOSIS — M7662 Achilles tendinitis, left leg: Secondary | ICD-10-CM

## 2023-04-06 MED ORDER — MELOXICAM 15 MG PO TABS
ORAL_TABLET | ORAL | 3 refills | Status: DC
Start: 2023-04-06 — End: 2023-04-07

## 2023-04-06 NOTE — Patient Instructions (Signed)
ACHILLES REHAB PROTOCOL Begin with easy walking, heel, toe and backwards  Do calf raises on a step:  First lower and then raise on 1 foot  If this is painful, lower on 1 foot, do the heel raise on both feet Begin with 3 sets of 10 repetitions  Increase by 5 repetitions every 3 days  Goal is 3 sets of 30 repetitions  Do with both straight knee and knee at 20 degrees of flexion  If pain persists, once you can do 3 sets of 30 without weight, add backpack with 5 lbs.  Increase by 5 lbs per week to max of 30 lbs for 3 sets of 15

## 2023-04-06 NOTE — Assessment & Plan Note (Signed)
This pleasant 52 year old female has had increasing pain right shoulder anteriorly worse with most motions. On exam she has positive speeds and Yergason test, only minimal labral and impingement signs. I do suspect she has a biceps tendinitis proximally. We discussed the anatomy and pathophysiology, adding x-rays, meloxicam, home physical therapy, return to see me in 6 weeks, injection if not better.

## 2023-04-06 NOTE — Assessment & Plan Note (Signed)
Recent trip to [place], did a lot of walking, now with discomfort Achilles on the left mid tendon. On exam she does have a small nodule. Negative Thompson's test. Adding heel lifts and eccentric physical therapy. No nitroglycerin just yet as this is only minimally uncomfortable. Return to see me in 6 weeks for this.

## 2023-04-06 NOTE — Progress Notes (Signed)
    Procedures performed today:    None.  Independent interpretation of notes and tests performed by another provider:   None.  Brief History, Exam, Impression, and Recommendations:    Biceps tendinopathy, right, proximal This pleasant 53 year old female has had increasing pain right shoulder anteriorly worse with most motions. On exam she has positive speeds and Yergason test, only minimal labral and impingement signs. I do suspect she has a biceps tendinitis proximally. We discussed the anatomy and pathophysiology, adding x-rays, meloxicam, home physical therapy, return to see me in 6 weeks, injection if not better.  Achilles tendinitis, left leg Recent trip to [place], did a lot of walking, now with discomfort Achilles on the left mid tendon. On exam she does have a small nodule. Negative Thompson's test. Adding heel lifts and eccentric physical therapy. No nitroglycerin just yet as this is only minimally uncomfortable. Return to see me in 6 weeks for this.    ____________________________________________ Ihor Austin. Benjamin Stain, M.D., ABFM., CAQSM., AME. Primary Care and Sports Medicine [facility]  Adjunct Professor of Family Medicine  [place] of [facility] [employer]  Restaurant manager, fast food

## 2023-04-10 DIAGNOSIS — M5413 Radiculopathy, cervicothoracic region: Secondary | ICD-10-CM | POA: Diagnosis not present

## 2023-04-10 DIAGNOSIS — M9901 Segmental and somatic dysfunction of cervical region: Secondary | ICD-10-CM | POA: Diagnosis not present

## 2023-04-10 DIAGNOSIS — M9904 Segmental and somatic dysfunction of sacral region: Secondary | ICD-10-CM | POA: Diagnosis not present

## 2023-04-10 DIAGNOSIS — M9902 Segmental and somatic dysfunction of thoracic region: Secondary | ICD-10-CM | POA: Diagnosis not present

## 2023-04-25 DIAGNOSIS — M9902 Segmental and somatic dysfunction of thoracic region: Secondary | ICD-10-CM | POA: Diagnosis not present

## 2023-04-25 DIAGNOSIS — M5413 Radiculopathy, cervicothoracic region: Secondary | ICD-10-CM | POA: Diagnosis not present

## 2023-04-25 DIAGNOSIS — M9901 Segmental and somatic dysfunction of cervical region: Secondary | ICD-10-CM | POA: Diagnosis not present

## 2023-04-25 DIAGNOSIS — M9904 Segmental and somatic dysfunction of sacral region: Secondary | ICD-10-CM | POA: Diagnosis not present

## 2023-05-18 ENCOUNTER — Ambulatory Visit: Payer: BC Managed Care – PPO | Admitting: Sports Medicine

## 2023-05-30 ENCOUNTER — Ambulatory Visit (INDEPENDENT_AMBULATORY_CARE_PROVIDER_SITE_OTHER): Payer: BC Managed Care – PPO | Admitting: Sports Medicine

## 2023-05-30 ENCOUNTER — Encounter: Payer: Self-pay | Admitting: Sports Medicine

## 2023-05-30 DIAGNOSIS — M67921 Unspecified disorder of synovium and tendon, right upper arm: Secondary | ICD-10-CM | POA: Diagnosis not present

## 2023-05-30 NOTE — Patient Instructions (Signed)

## 2023-05-30 NOTE — Assessment & Plan Note (Signed)
Pleasant 52 year old female returns, she had mostly bicipital signs at the last visit, she has done extremely well with meloxicam and home physical therapy, she is for the most part pain-free, she also has some pain in her elbow, worse with terminal flexion and extension, suspect early osteoarthritis, I will add some elbow conditioning as well, Mediterranean diet, return as needed.

## 2023-05-30 NOTE — Progress Notes (Signed)
    Procedures performed today:    None.  Independent interpretation of notes and tests performed by another provider:   None.  Brief History, Exam, Impression, and Recommendations:    Biceps tendinopathy, right, proximal Pleasant 52 year old female returns, she had mostly bicipital signs at the last visit, she has done extremely well with meloxicam and home physical therapy, she is for the most part pain-free, she also has some pain in her elbow, worse with terminal flexion and extension, suspect early osteoarthritis, I will add some elbow conditioning as well, Mediterranean diet, return as needed.    ____________________________________________ Ihor Austin. Benjamin Stain, M.D., ABFM., CAQSM., AME. Primary Care and Sports Medicine [facility]  Adjunct Professor of Family Medicine  [place] of [facility] [employer] [employer]  Restaurant manager, fast food

## 2023-06-02 ENCOUNTER — Ambulatory Visit (INDEPENDENT_AMBULATORY_CARE_PROVIDER_SITE_OTHER): Payer: BC Managed Care – PPO | Admitting: Medical-Surgical

## 2023-06-02 ENCOUNTER — Encounter: Payer: Self-pay | Admitting: Medical-Surgical

## 2023-06-02 VITALS — BP 106/70 | HR 74 | Resp 20 | Ht 66.0 in | Wt 130.8 lb

## 2023-06-02 DIAGNOSIS — Z131 Encounter for screening for diabetes mellitus: Secondary | ICD-10-CM

## 2023-06-02 DIAGNOSIS — F39 Unspecified mood [affective] disorder: Secondary | ICD-10-CM

## 2023-06-02 DIAGNOSIS — Z13220 Encounter for screening for lipoid disorders: Secondary | ICD-10-CM

## 2023-06-02 DIAGNOSIS — Z1329 Encounter for screening for other suspected endocrine disorder: Secondary | ICD-10-CM

## 2023-06-02 DIAGNOSIS — Z0000 Encounter for general adult medical examination without abnormal findings: Secondary | ICD-10-CM

## 2023-06-02 NOTE — Patient Instructions (Signed)
Preventive Care 52-52 Years Old, Female Preventive care refers to lifestyle choices and visits with your health care provider that can promote health and wellness. Preventive care visits are also called wellness exams. What can I expect for my preventive care visit? Counseling Your health care provider may ask you questions about your: Medical history, including: Past medical problems. Family medical history. Pregnancy history. Current health, including: Menstrual cycle. Method of birth control. Emotional well-being. Home life and relationship well-being. Sexual activity and sexual health. Lifestyle, including: Alcohol, nicotine or tobacco, and drug use. Access to firearms. Diet, exercise, and sleep habits. Work and work environment. Sunscreen use. Safety issues such as seatbelt and bike helmet use. Physical exam Your health care provider will check your: Height and weight. These may be used to calculate your BMI (body mass index). BMI is a measurement that tells if you are at a healthy weight. Waist circumference. This measures the distance around your waistline. This measurement also tells if you are at a healthy weight and may help predict your risk of certain diseases, such as type 2 diabetes and high blood pressure. Heart rate and blood pressure. Body temperature. Skin for abnormal spots. What immunizations do I need?  Vaccines are usually given at various ages, according to a schedule. Your health care provider will recommend vaccines for you based on your age, medical history, and lifestyle or other factors, such as travel or where you work. What tests do I need? Screening Your health care provider may recommend screening tests for certain conditions. This may include: Lipid and cholesterol levels. Diabetes screening. This is done by checking your blood sugar (glucose) after you have not eaten for a while (fasting). Pelvic exam and Pap test. Hepatitis B test. Hepatitis C  test. HIV (human immunodeficiency virus) test. STI (sexually transmitted infection) testing, if you are at risk. Lung cancer screening. Colorectal cancer screening. Mammogram. Talk with your health care provider about when you should start having regular mammograms. This may depend on whether you have a family history of breast cancer. BRCA-related cancer screening. This may be done if you have a family history of breast, ovarian, tubal, or peritoneal cancers. Bone density scan. This is done to screen for osteoporosis. Talk with your health care provider about your test results, treatment options, and if necessary, the need for more tests. Follow these instructions at home: Eating and drinking  Eat a diet that includes fresh fruits and vegetables, whole grains, lean protein, and low-fat dairy products. Take vitamin and mineral supplements as recommended by your health care provider. Do not drink alcohol if: Your health care provider tells you not to drink. You are pregnant, may be pregnant, or are planning to become pregnant. If you drink alcohol: Limit how much you have to 0-1 drink a day. Know how much alcohol is in your drink. In the [place] one drink equals one 12 oz bottle of beer (355 mL), one 5 oz glass of wine (148 mL), or one 1 oz glass of hard liquor (44 mL). Lifestyle Brush your teeth every morning and night with fluoride toothpaste. Floss one time each day. Exercise for at least 30 minutes 5 or more days each week. Do not use any products that contain nicotine or tobacco. These products include cigarettes, chewing tobacco, and vaping devices, such as e-cigarettes. If you need help quitting, ask your health care provider. Do not use drugs. If you are sexually active, practice safe sex. Use a condom or other form of protection to   prevent STIs. If you do not wish to become pregnant, use a form of birth control. If you plan to become pregnant, see your health care provider for a  prepregnancy visit. Take aspirin only as told by your health care provider. Make sure that you understand how much to take and what form to take. Work with your health care provider to find out whether it is safe and beneficial for you to take aspirin daily. Find healthy ways to manage stress, such as: Meditation, yoga, or listening to music. Journaling. Talking to a trusted person. Spending time with friends and family. Minimize exposure to UV radiation to reduce your risk of skin cancer. Safety Always wear your seat belt while driving or riding in a vehicle. Do not drive: If you have been drinking alcohol. Do not ride with someone who has been drinking. When you are tired or distracted. While texting. If you have been using any mind-altering substances or drugs. Wear a helmet and other protective equipment during sports activities. If you have firearms in your house, make sure you follow all gun safety procedures. Seek help if you have been physically or sexually abused. What's next? Visit your health care provider once a year for an annual wellness visit. Ask your health care provider how often you should have your eyes and teeth checked. Stay up to date on all vaccines. This information is not intended to replace advice given to you by your health care provider. Make sure you discuss any questions you have with your health care provider. Document Revised: 02/03/2021 Document Reviewed: 02/03/2021 Elsevier Patient Education  2024 [employer]  

## 2023-06-02 NOTE — Progress Notes (Signed)
Complete physical exam  Patient: Katelyn Hernandez   DOB: 03-Aug-1971   52 y.o. Female  MRN: [id number]  Subjective:    Chief Complaint  Patient presents with   Annual Exam   Katelyn Hernandez is a 52 y.o. female who presents today for a complete physical exam. She reports consuming a general diet.  Doing regular exercise and has incorporated weight lifting.  She generally feels well. She reports sleeping fairly well. She does not have additional problems to discuss today.    Most recent fall risk assessment:    06/02/2023    9:24 AM  Fall Risk   Falls in the past year? 0  Number falls in past yr: 0  Injury with Fall? 0  Risk for fall due to : No Fall Risks  Follow up Falls evaluation completed     Most recent depression screenings:    06/02/2023    9:24 AM 05/31/2022    9:30 AM  PHQ 2/9 Scores  PHQ - 2 Score 0 0  PHQ- 9 Score  0    Vision:Within last year, Dental: No current dental problems and Receives regular dental care, and STD: The patient denies history of sexually transmitted disease.    Patient Care Team: Christen Butter, NP as PCP - General (Nurse Practitioner)   Outpatient Medications Prior to Visit  Medication Sig   DHEA 25 MG CAPS Take by mouth.   escitalopram (LEXAPRO) 5 MG tablet TAKE 1 TABLET (5 MG TOTAL) BY MOUTH DAILY.   meloxicam (MOBIC) 15 MG tablet TAKE ONE TAB EVERY 24 HOURS WITH A MEAL FOR 2 WEEKS, THEN ONCE EVERY 24 HOURS AS NEEDED FOR PAIN.   progesterone 200 MG SUPP Place 200 mg vaginally at bedtime.   No facility-administered medications prior to visit.   Review of Systems  Constitutional:  Negative for chills, fever, malaise/fatigue and weight loss.  HENT:  Negative for congestion, ear pain, hearing loss, sinus pain and sore throat.   Eyes:  Negative for blurred vision, photophobia and pain.  Respiratory:  Negative for cough, shortness of breath and wheezing.   Cardiovascular:  Negative for chest pain, palpitations and leg swelling.   Gastrointestinal:  Negative for abdominal pain, constipation, diarrhea, heartburn, nausea and vomiting.  Genitourinary:  Negative for dysuria, frequency and urgency.  Musculoskeletal:  Negative for falls and neck pain.  Skin:  Negative for itching and rash.  Neurological:  Negative for dizziness, weakness and headaches.  Endo/Heme/Allergies:  Negative for polydipsia. Does not bruise/bleed easily.  Psychiatric/Behavioral:  Negative for depression, substance abuse and suicidal ideas. The patient is not nervous/anxious and does not have insomnia.      Objective:    BP 106/70 (BP Location: Left Arm, Cuff Size: Normal)   Pulse 74   Resp 20   Ht 5\' 6"  (1.676 m)   Wt 130 lb 12.8 oz (59.3 kg)   LMP 02/20/2018   SpO2 100%   BMI 21.11 kg/m    Physical Exam Vitals reviewed.  Constitutional:      General: She is not in acute distress.    Appearance: Normal appearance. She is not ill-appearing.  HENT:     Head: Normocephalic and atraumatic.     Right Ear: Tympanic membrane, ear canal and external ear normal. There is no impacted cerumen.     Left Ear: Tympanic membrane, ear canal and external ear normal. There is no impacted cerumen.     Nose: Nose normal. No congestion or rhinorrhea.  Mouth/Throat:     Mouth: Mucous membranes are moist.     Pharynx: No oropharyngeal exudate or posterior oropharyngeal erythema.  Eyes:     General: No scleral icterus.       Right eye: No discharge.        Left eye: No discharge.     Extraocular Movements: Extraocular movements intact.     Conjunctiva/sclera: Conjunctivae normal.     Pupils: Pupils are equal, round, and reactive to light.  Neck:     Thyroid: No thyromegaly.     Vascular: No carotid bruit or JVD.     Trachea: Trachea normal.  Cardiovascular:     Rate and Rhythm: Normal rate and regular rhythm.     Pulses: Normal pulses.     Heart sounds: Normal heart sounds. No murmur heard.    No friction rub. No gallop.  Pulmonary:      Effort: Pulmonary effort is normal. No respiratory distress.     Breath sounds: Normal breath sounds. No wheezing.  Abdominal:     General: Bowel sounds are normal. There is no distension.     Palpations: Abdomen is soft.     Tenderness: There is no abdominal tenderness. There is no guarding.  Musculoskeletal:        General: Normal range of motion.     Cervical back: Normal range of motion and neck supple.  Lymphadenopathy:     Cervical: No cervical adenopathy.  Skin:    General: Skin is warm and dry.  Neurological:     Mental Status: She is alert and oriented to person, place, and time.     Cranial Nerves: No cranial nerve deficit.  Psychiatric:        Mood and Affect: Mood normal.        Behavior: Behavior normal.        Thought Content: Thought content normal.        Judgment: Judgment normal.    No results found for any visits on 06/02/23.     Assessment & Plan:    Routine Health Maintenance and Physical Exam   There is no immunization history on file for this patient.  Health Maintenance  Topic Date Due   DTaP/Tdap/Td (1 - Tdap) Never done   Cervical Cancer Screening (HPV/Pap Cotest)  Never done   Colonoscopy  Never done   Zoster Vaccines- Shingrix (1 of 2) Never done   COVID-19 Vaccine (1 - 2023-24 season) 06/18/2023 (Originally 04/23/2023)   INFLUENZA VACCINE  11/20/2023 (Originally 03/23/2023)   Hepatitis C Screening  06/01/2024 (Originally 10/22/1988)   HIV Screening  06/01/2024 (Originally 10/22/1985)   MAMMOGRAM  07/26/2024   HPV VACCINES  Aged Out    Discussed health benefits of physical activity, and encouraged her to engage in regular exercise appropriate for her age and condition.  1. Annual physical exam Checking labs as below. UTD on preventative care. Wellness information provided with AVS. - CBC with Differential/Platelet - CMP14+EGFR  2. Lipid screening Checking lipids today. - Lipid panel  3. Thyroid disorder screen Checking TSH. - TSH  4.  Diabetes mellitus screening Checking A1c. - Hemoglobin A1c  5. Mood disorder (HCC) Stable on Lexapro 5 mg daily with as needed use.   Return in about 1 year (around 06/01/2024) for annual physical exam or sooner if needed.   Christen Butter, NP

## 2023-06-03 LAB — CBC WITH DIFFERENTIAL/PLATELET
Basophils Absolute: 0 10*3/uL (ref 0.0–0.2)
Basos: 1 %
EOS (ABSOLUTE): 0.1 10*3/uL (ref 0.0–0.4)
Eos: 2 %
Hematocrit: 42.4 % (ref 34.0–46.6)
Hemoglobin: 13.6 g/dL (ref 11.1–15.9)
Immature Grans (Abs): 0 10*3/uL (ref 0.0–0.1)
Immature Granulocytes: 0 %
Lymphocytes Absolute: 1.7 10*3/uL (ref 0.7–3.1)
Lymphs: 36 %
MCH: 31.1 pg (ref 26.6–33.0)
MCHC: 32.1 g/dL (ref 31.5–35.7)
MCV: 97 fL (ref 79–97)
Monocytes Absolute: 0.4 10*3/uL (ref 0.1–0.9)
Monocytes: 8 %
Neutrophils Absolute: 2.6 10*3/uL (ref 1.4–7.0)
Neutrophils: 53 %
Platelets: 294 10*3/uL (ref 150–450)
RBC: 4.37 x10E6/uL (ref 3.77–5.28)
RDW: 12.1 % (ref 11.7–15.4)
WBC: 4.8 10*3/uL (ref 3.4–10.8)

## 2023-06-03 LAB — LIPID PANEL
Chol/HDL Ratio: 3.6 {ratio} (ref 0.0–4.4)
Cholesterol, Total: 225 mg/dL — ABNORMAL HIGH (ref 100–199)
HDL: 63 mg/dL (ref >39)
LDL Chol Calc (NIH): 150 mg/dL — ABNORMAL HIGH (ref 0–99)
Triglycerides: 68 mg/dL (ref 0–149)
VLDL Cholesterol Cal: 12 mg/dL (ref 5–40)

## 2023-06-03 LAB — CMP14+EGFR
ALT: 17 [IU]/L (ref 0–32)
AST: 29 [IU]/L (ref 0–40)
Albumin: 4.9 g/dL (ref 3.8–4.9)
Alkaline Phosphatase: 56 [IU]/L (ref 44–121)
BUN/Creatinine Ratio: 15 (ref 9–23)
BUN: 11 mg/dL (ref 6–24)
Bilirubin Total: 0.8 mg/dL (ref 0.0–1.2)
CO2: 24 mmol/L (ref 20–29)
Calcium: 10 mg/dL (ref 8.7–10.2)
Chloride: 98 mmol/L (ref 96–106)
Creatinine, Ser: 0.71 mg/dL (ref 0.57–1.00)
Globulin, Total: 2.6 g/dL (ref 1.5–4.5)
Glucose: 79 mg/dL (ref 70–99)
Potassium: 4.1 mmol/L (ref 3.5–5.2)
Sodium: 139 mmol/L (ref 134–144)
Total Protein: 7.5 g/dL (ref 6.0–8.5)
eGFR: 102 mL/min/{1.73_m2} (ref >59)

## 2023-06-03 LAB — TSH: TSH: 1.74 u[IU]/mL (ref 0.450–4.500)

## 2023-06-03 LAB — HEMOGLOBIN A1C
Est. average glucose Bld gHb Est-mCnc: 108 mg/dL
Hgb A1c MFr Bld: 5.4 % (ref 4.8–5.6)

## 2023-06-05 ENCOUNTER — Encounter: Payer: Self-pay | Admitting: Medical-Surgical

## 2023-06-14 ENCOUNTER — Other Ambulatory Visit: Payer: Self-pay | Admitting: Medical-Surgical

## 2023-06-14 DIAGNOSIS — F39 Unspecified mood [affective] disorder: Secondary | ICD-10-CM

## 2023-09-08 LAB — HM MAMMOGRAPHY

## 2024-01-10 ENCOUNTER — Ambulatory Visit (INDEPENDENT_AMBULATORY_CARE_PROVIDER_SITE_OTHER)

## 2024-01-10 ENCOUNTER — Ambulatory Visit (INDEPENDENT_AMBULATORY_CARE_PROVIDER_SITE_OTHER): Admitting: Medical-Surgical

## 2024-01-10 ENCOUNTER — Encounter: Payer: Self-pay | Admitting: Medical-Surgical

## 2024-01-10 VITALS — BP 106/71 | HR 72 | Resp 20 | Ht 66.0 in | Wt 133.1 lb

## 2024-01-10 DIAGNOSIS — R1011 Right upper quadrant pain: Secondary | ICD-10-CM

## 2024-01-10 DIAGNOSIS — R1031 Right lower quadrant pain: Secondary | ICD-10-CM

## 2024-01-10 DIAGNOSIS — F39 Unspecified mood [affective] disorder: Secondary | ICD-10-CM | POA: Diagnosis not present

## 2024-01-10 MED ORDER — PANTOPRAZOLE SODIUM 20 MG PO TBEC: 20.0000 mg | DELAYED_RELEASE_TABLET | Freq: Every day | ORAL | 1 refills | Status: DC

## 2024-01-10 NOTE — Progress Notes (Signed)
        Established patient visit  History, exam, impression, and plan:  1. Mood disorder (HCC) (Primary) Pleasant 53 year old female presenting today with history of mood disorder currently treated with Lexapro  5 mg daily.  She tends to avoid leaning on Lexapro  too much as she would ultimately like to be off of the medication.  Unfortunately, she has been under extreme stress lately and having difficulty with managing her mood.  Notes that she is not feeling depressed as she has in the past however she has trouble with feeling overwhelmed and crying.  Has been taking the Lexapro  daily as prescribed and feels that it is helping.  Not interested in adjusting her medication at this time.  She is tearful during the appointment, notably anxious.  Denies SI/HI.  Not currently doing counseling and at this point does not feel like she has the time to contribute.  For now continue Lexapro  5 mg daily.  Advised to let me know if there is anything I can do to help support her during this stressful time.  Patient verbalized understanding and is agreeable to the plan.  2. Right lower quadrant abdominal pain Has been having right lower quadrant and right upper quadrant abdominal pain that is intermittent.  Has been going on for several weeks.  Notes that it is nearly constant however it does have periods of exacerbation.  Has not found anything that relieves or aggravates the pain.  She does still have her gallbladder as well as her appendix.  She is also experiencing some acid reflux symptoms like burning in the chest as well as a globus sensation at times.  Not currently taking any medications for any of this.  With her increased rest, she often has GI symptoms but this feels different to her.  She denies nausea, vomiting, fever, chills, diarrhea, and constipation.  Denies melena, hematochezia, and hematemesis.  On exam, she does have some right lower quadrant tenderness to palpation as well as some rebound tenderness  with palpation of the left lower quadrant.  No reproducible pain in the right upper quadrant today.  Abdomen is soft, nondistended with normal bowel sounds.  No HSM noted.  Unclear etiology.  Discussed recommendations for lab evaluation and imaging.  She is agreeable so checking CBC with differential, CMP, amylase, and lipase.  Getting a CT abdomen pelvis without contrast for further investigation.  For the globus sensation and reflux symptoms, start Protonix 20 mg daily for 2 weeks then daily as needed.  Further follow-up pending results.   Procedures performed this visit: None.  Return if symptoms worsen or fail to improve.  __________________________________ Katelyn Snook, DNP, APRN, FNP-BC Primary Care and Sports Medicine [facility]

## 2024-01-11 ENCOUNTER — Ambulatory Visit: Payer: Self-pay | Admitting: Medical-Surgical

## 2024-01-11 LAB — CBC WITH DIFFERENTIAL/PLATELET
Basophils Absolute: 0 10*3/uL (ref 0.0–0.2)
Basos: 1 %
EOS (ABSOLUTE): 0.1 10*3/uL (ref 0.0–0.4)
Eos: 1 %
Hematocrit: 40.5 % (ref 34.0–46.6)
Hemoglobin: 13 g/dL (ref 11.1–15.9)
Immature Grans (Abs): 0 10*3/uL (ref 0.0–0.1)
Immature Granulocytes: 0 %
Lymphocytes Absolute: 2 10*3/uL (ref 0.7–3.1)
Lymphs: 46 %
MCH: 30.7 pg (ref 26.6–33.0)
MCHC: 32.1 g/dL (ref 31.5–35.7)
MCV: 96 fL (ref 79–97)
Monocytes Absolute: 0.5 10*3/uL (ref 0.1–0.9)
Monocytes: 11 %
Neutrophils Absolute: 1.7 10*3/uL (ref 1.4–7.0)
Neutrophils: 41 %
Platelets: 304 10*3/uL (ref 150–450)
RBC: 4.24 x10E6/uL (ref 3.77–5.28)
RDW: 12.9 % (ref 11.7–15.4)
WBC: 4.3 10*3/uL (ref 3.4–10.8)

## 2024-01-11 LAB — CMP14+EGFR
ALT: 11 IU/L (ref 0–32)
AST: 19 IU/L (ref 0–40)
Albumin: 4.6 g/dL (ref 3.8–4.9)
Alkaline Phosphatase: 58 IU/L (ref 44–121)
BUN/Creatinine Ratio: 23 (ref 9–23)
BUN: 16 mg/dL (ref 6–24)
Bilirubin Total: 0.3 mg/dL (ref 0.0–1.2)
CO2: 23 mmol/L (ref 20–29)
Calcium: 9.5 mg/dL (ref 8.7–10.2)
Chloride: 100 mmol/L (ref 96–106)
Creatinine, Ser: 0.7 mg/dL (ref 0.57–1.00)
Globulin, Total: 2.4 g/dL (ref 1.5–4.5)
Glucose: 106 mg/dL — ABNORMAL HIGH (ref 70–99)
Potassium: 4.8 mmol/L (ref 3.5–5.2)
Sodium: 139 mmol/L (ref 134–144)
Total Protein: 7 g/dL (ref 6.0–8.5)
eGFR: 103 mL/min/{1.73_m2} (ref >59)

## 2024-01-11 LAB — AMYLASE: Amylase: 40 U/L (ref 31–110)

## 2024-01-11 LAB — LIPASE: Lipase: 28 U/L (ref 14–72)

## 2024-01-16 MED ORDER — DICYCLOMINE HCL 10 MG PO CAPS: 10.0000 mg | ORAL_CAPSULE | Freq: Three times a day (TID) | ORAL | 0 refills | Status: DC

## 2024-01-23 LAB — HM COLONOSCOPY

## 2024-02-01 ENCOUNTER — Other Ambulatory Visit: Payer: Self-pay | Admitting: Medical-Surgical

## 2024-02-11 ENCOUNTER — Other Ambulatory Visit: Payer: Self-pay | Admitting: Medical-Surgical

## 2024-02-12 NOTE — Telephone Encounter (Signed)
 Please advise on refill request

## 2024-02-14 NOTE — Telephone Encounter (Signed)
Pharmacy is requesting 90 days supply

## 2024-04-23 ENCOUNTER — Encounter: Payer: Self-pay | Admitting: Sports Medicine

## 2024-07-27 ENCOUNTER — Other Ambulatory Visit: Payer: Self-pay | Admitting: Medical-Surgical

## 2024-07-29 ENCOUNTER — Other Ambulatory Visit: Payer: Self-pay | Admitting: Medical-Surgical

## 2024-09-03 ENCOUNTER — Telehealth: Admitting: Family Medicine

## 2024-09-03 DIAGNOSIS — B029 Zoster without complications: Secondary | ICD-10-CM | POA: Diagnosis not present

## 2024-09-03 MED ORDER — VALACYCLOVIR HCL 1 G PO TABS: 1000.0000 mg | ORAL_TABLET | Freq: Three times a day (TID) | ORAL | 0 refills | Status: AC

## 2024-09-03 NOTE — Progress Notes (Signed)
 E-visit for Shingles   We are sorry that you are not feeling well. Here is how we plan to help!  Based on what you shared with me it looks like you have shingles.  Shingles or herpes zoster, is a common infection of the nerves.  It is a painful rash caused by the herpes zoster virus.  This is the same virus that causes chickenpox.  After a person has chickenpox, the virus remains inactive in the nerve cells.  Years later, the virus can become active again and travel to the skin.  It typically will appear on one side of the face or body.  Burning or shooting pain, tingling, or itching are early signs of the infection.  Blisters typically scab over in 7 to 10 days and clear up within 2-4 weeks. Shingles is only contagious to people that have never had the chickenpox, the chickenpox vaccine, or anyone who has a compromised immune system.  You should avoid contact with these type of people until your blisters scab over.  I have prescribed Valacyclovir  1g three times daily for 7 days/  HOME CARE: Apply ice packs (wrapped in a thin towel), cool compresses, or soak in cool bath to help reduce pain. Use calamine lotion to calm itchy skin. Avoid scratching the rash. Avoid direct sunlight.  GET HELP RIGHT AWAY IF: Symptoms that dont away after treatment. A rash or blisters near your eye. Increased drainage, fever, or rash after treatment. Severe pain that doesnt go away.   MAKE SURE YOU   Understand these instructions. Will watch your condition. Will get help right away if you are not doing well or get worse.  Thank you for choosing an e-visit. Your e-visit answers were reviewed by a board certified advanced clinical practitioner to complete your personal care plan. Depending upon the condition, your plan could have included both over the counter or prescription medications.  Please review your pharmacy choice. Make sure the pharmacy is open so you can pick up prescription now. If there is a  problem, you may contact your provider through [employer] and have the prescription routed to another pharmacy.  Your safety is important to us . If you have drug allergies check your prescription carefully.   For the next 24 hours you can use MyChart to ask questions about todays visit, request a non-urgent call back, or ask for a work or school excuse.  You will get an email in the next two days asking about your experience. I hope that your e-visit has been valuable and will speed your recovery  I have spent 5 minutes in review of e-visit questionnaire, review and updating patient chart, medical decision making and response to patient.   Melanie Openshaw, FNP
